# Patient Record
Sex: Female | Born: 1965 | Race: White | Hispanic: No | Marital: Married | Smoking: Former smoker
Health system: Southern US, Community
[De-identification: ages and names within clinical notes are randomized; demographics above are authoritative.]

## PROBLEM LIST (undated history)

## (undated) DIAGNOSIS — F32A Depression, unspecified: Secondary | ICD-10-CM

## (undated) DIAGNOSIS — Z6834 Body mass index (BMI) 34.0-34.9, adult: Secondary | ICD-10-CM

## (undated) DIAGNOSIS — E669 Obesity, unspecified: Secondary | ICD-10-CM

## (undated) DIAGNOSIS — F419 Anxiety disorder, unspecified: Secondary | ICD-10-CM

## (undated) DIAGNOSIS — J309 Allergic rhinitis, unspecified: Secondary | ICD-10-CM

## (undated) DIAGNOSIS — Z6835 Body mass index (BMI) 35.0-35.9, adult: Secondary | ICD-10-CM

## (undated) DIAGNOSIS — K219 Gastro-esophageal reflux disease without esophagitis: Secondary | ICD-10-CM

## (undated) DIAGNOSIS — F3189 Other bipolar disorder: Secondary | ICD-10-CM

## (undated) DIAGNOSIS — F410 Panic disorder [episodic paroxysmal anxiety] without agoraphobia: Secondary | ICD-10-CM

## (undated) DIAGNOSIS — E785 Hyperlipidemia, unspecified: Secondary | ICD-10-CM

## (undated) DIAGNOSIS — G603 Idiopathic progressive neuropathy: Secondary | ICD-10-CM

## (undated) DIAGNOSIS — I10 Essential (primary) hypertension: Secondary | ICD-10-CM

## (undated) DIAGNOSIS — M1990 Unspecified osteoarthritis, unspecified site: Secondary | ICD-10-CM

## (undated) DIAGNOSIS — G709 Myoneural disorder, unspecified: Secondary | ICD-10-CM

## (undated) DIAGNOSIS — R4701 Aphasia: Secondary | ICD-10-CM

## (undated) DIAGNOSIS — E559 Vitamin D deficiency, unspecified: Secondary | ICD-10-CM

## (undated) HISTORY — DX: Myoneural disorder, unspecified: G70.9

## (undated) HISTORY — DX: Depression, unspecified: F32.A

## (undated) HISTORY — PX: CHOLECYSTECTOMY: SHX55

## (undated) HISTORY — DX: Obesity, unspecified: E66.9

## (undated) HISTORY — PX: APPENDECTOMY: SHX54

## (undated) HISTORY — DX: Aphasia: R47.01

## (undated) HISTORY — DX: Anxiety disorder, unspecified: F41.9

## (undated) HISTORY — PX: BREAST SURGERY: SHX581

## (undated) HISTORY — PX: SPINE SURGERY: SHX786

## (undated) HISTORY — DX: Vitamin D deficiency, unspecified: E55.9

## (undated) HISTORY — DX: Panic disorder (episodic paroxysmal anxiety): F41.0

## (undated) HISTORY — DX: Allergic rhinitis, unspecified: J30.9

## (undated) HISTORY — DX: Essential (primary) hypertension: I10

## (undated) HISTORY — DX: Body mass index (BMI) 35.0-35.9, adult: Z68.35

## (undated) HISTORY — DX: Idiopathic progressive neuropathy: G60.3

## (undated) HISTORY — DX: Other bipolar disorder: F31.89

## (undated) HISTORY — PX: ABDOMINAL HYSTERECTOMY: SHX81

## (undated) HISTORY — DX: Unspecified osteoarthritis, unspecified site: M19.90

## (undated) HISTORY — DX: Gastro-esophageal reflux disease without esophagitis: K21.9

## (undated) HISTORY — DX: Body mass index (BMI) 34.0-34.9, adult: Z68.34

## (undated) HISTORY — DX: Hyperlipidemia, unspecified: E78.5

---

## 2017-12-18 DIAGNOSIS — N6310 Unspecified lump in the right breast, unspecified quadrant: Secondary | ICD-10-CM

## 2017-12-18 HISTORY — DX: Unspecified lump in the right breast, unspecified quadrant: N63.10

## 2018-01-22 DIAGNOSIS — Z09 Encounter for follow-up examination after completed treatment for conditions other than malignant neoplasm: Secondary | ICD-10-CM | POA: Insufficient documentation

## 2021-03-28 ENCOUNTER — Ambulatory Visit: Payer: 59 | Admitting: Podiatry

## 2021-03-28 ENCOUNTER — Ambulatory Visit (INDEPENDENT_AMBULATORY_CARE_PROVIDER_SITE_OTHER): Payer: 59

## 2021-03-28 ENCOUNTER — Other Ambulatory Visit: Payer: Self-pay

## 2021-03-28 DIAGNOSIS — M25371 Other instability, right ankle: Secondary | ICD-10-CM

## 2021-03-28 DIAGNOSIS — M25571 Pain in right ankle and joints of right foot: Secondary | ICD-10-CM | POA: Diagnosis not present

## 2021-03-28 DIAGNOSIS — M25471 Effusion, right ankle: Secondary | ICD-10-CM | POA: Diagnosis not present

## 2021-03-28 NOTE — Patient Instructions (Signed)
Ankle Instability Exercises:  Make sure you have a chair or wall in front of you for support at all times. You do not wear your brace while you do these exercises.   Stand with your feet hip-width apart and your hands on your hips. Shift your weight onto your left foot and lift your right foot a few inches off of the ground. Stand in this position for 30 seconds and switch sides. Repeat 2-3 times  Start at week 1. Only progress to the next week's activity if you can safely perform the exercise you are doing.  Week 1: Wearing shoes Week 2: Barefoot Week 3: Wearing shoes, eyes closed Week 4: Barefoot, eyes closed

## 2021-03-28 NOTE — Progress Notes (Signed)
  Subjective:  Patient ID: Yvonne Clark, female    DOB: [birth year],  MRN: [id number]  Chief Complaint  Patient presents with   Pain    Rt lateral ankle pain -radiates up the knee - x 3 yrs - no recent injury; 6 yrs ago rolled ankle on flat surface -pain started after working out -w/ swelling Tx: elevation, IBU and icing     55 y.o. female presents with the above complaint. History confirmed with patient.   Objective:  Physical Exam: warm, good capillary refill, no trophic changes or ulcerative lesions, normal DP and PT pulses, and normal sensory exam.  Right Foot: Right ankle POP at ATFL. No pain at CFL. +Anterior drawer right. Mild edema right ankle. Excessive inversion noted c/t contralateral limb.  No images are attached to the encounter.  Radiographs: X-ray of the right ankle: no fracture, dislocation, swelling or degenerative changes noted Assessment:   1. Instability of right ankle joint   2. Pain and swelling of right ankle     Plan:  Patient was evaluated and treated and all questions answered.  Ankle Instability Right Pod Sprain: -XR taken and reviewed with patient -Educated on etiology of injury -ASO Brace dispensed -Balance and Proprioception exercises dispensed.  Return in about 4 weeks (around 04/25/2021) for Ankle instability.

## 2021-04-25 ENCOUNTER — Ambulatory Visit: Payer: 59 | Admitting: Podiatry

## 2021-05-16 ENCOUNTER — Ambulatory Visit (INDEPENDENT_AMBULATORY_CARE_PROVIDER_SITE_OTHER): Payer: 59 | Admitting: Podiatry

## 2021-05-16 DIAGNOSIS — M25371 Other instability, right ankle: Secondary | ICD-10-CM

## 2021-05-16 DIAGNOSIS — M25571 Pain in right ankle and joints of right foot: Secondary | ICD-10-CM

## 2021-05-16 DIAGNOSIS — M25471 Effusion, right ankle: Secondary | ICD-10-CM

## 2021-05-16 NOTE — Progress Notes (Signed)
No show for appt - same day cancellation. 

## 2021-06-09 ENCOUNTER — Other Ambulatory Visit: Payer: Self-pay

## 2021-06-09 ENCOUNTER — Ambulatory Visit (INDEPENDENT_AMBULATORY_CARE_PROVIDER_SITE_OTHER): Payer: 59 | Admitting: Podiatry

## 2021-06-09 ENCOUNTER — Encounter: Payer: Self-pay | Admitting: Podiatry

## 2021-06-09 DIAGNOSIS — M25571 Pain in right ankle and joints of right foot: Secondary | ICD-10-CM

## 2021-06-09 DIAGNOSIS — M25471 Effusion, right ankle: Secondary | ICD-10-CM | POA: Diagnosis not present

## 2021-06-09 DIAGNOSIS — M25371 Other instability, right ankle: Secondary | ICD-10-CM | POA: Diagnosis not present

## 2021-06-09 MED ORDER — MELOXICAM 15 MG PO TABS: 15.0000 mg | ORAL_TABLET | Freq: Every day | ORAL | 0 refills | Status: DC

## 2021-06-16 NOTE — Progress Notes (Signed)
  Subjective:  Patient ID: Yvonne Clark, female    DOB: [birth year],  MRN: [id number]  Chief Complaint  Patient presents with   Foot Pain    Somewhat better and the brace hurts more and hurts my hips and I have had 4 back surgeries   55 y.o. female presents with the above complaint. History confirmed with patient.   Objective:  Physical Exam: warm, good capillary refill, no trophic changes or ulcerative lesions, normal DP and PT pulses, and normal sensory exam.  Right Foot: Right ankle POP at ATFL. No pain at CFL. +Anterior drawer right. Mild edema right ankle. Excessive inversion noted c/t contralateral limb.  Assessment:   1. Instability of right ankle joint   2. Pain and swelling of right ankle    Plan:  Patient was evaluated and treated and all questions answered.  Ankle Instability Right -I advised that if the brace is hurting more she can d/c it. -Rx Meloxicam -Refer to Physical Therapy for balance, strength, proprioception.  Return in about 6 weeks (around 07/21/2021) for ankle instability.

## 2021-06-30 ENCOUNTER — Other Ambulatory Visit: Payer: Self-pay | Admitting: Podiatry

## 2021-06-30 NOTE — Telephone Encounter (Signed)
Please advise 

## 2021-07-14 ENCOUNTER — Ambulatory Visit (INDEPENDENT_AMBULATORY_CARE_PROVIDER_SITE_OTHER): Payer: 59 | Admitting: Podiatry

## 2021-07-14 ENCOUNTER — Encounter: Payer: Self-pay | Admitting: Podiatry

## 2021-07-14 ENCOUNTER — Other Ambulatory Visit: Payer: Self-pay

## 2021-07-14 DIAGNOSIS — M25471 Effusion, right ankle: Secondary | ICD-10-CM | POA: Diagnosis not present

## 2021-07-14 DIAGNOSIS — M25571 Pain in right ankle and joints of right foot: Secondary | ICD-10-CM

## 2021-07-14 DIAGNOSIS — M25371 Other instability, right ankle: Secondary | ICD-10-CM

## 2021-07-14 NOTE — Progress Notes (Signed)
  Subjective:  Patient ID: Yvonne Clark, female    DOB: [birth year],  MRN: [id number]  Chief Complaint  Patient presents with   Foot Pain    I am not any better and the medicine knocked me out and PT stated that they could not do anything on the right ankle   55 y.o. female presents with the above complaint. History confirmed with patient. Feels like the ankle is not any better still cannot wear the brace and could not tolerate the medication she was prescribed.  Objective:  Physical Exam: warm, good capillary refill, no trophic changes or ulcerative lesions, normal DP and PT pulses, and normal sensory exam.  Right Foot: Right ankle POP at ATFL. No pain at CFL. +Anterior drawer right. Mild edema right ankle. Excessive inversion noted c/t contralateral limb.  Assessment:   1. Instability of right ankle joint   2. Pain and swelling of right ankle    Plan:  Patient was evaluated and treated and all questions answered.  Ankle Instability Right -She has now failed bracing, PT, Anti-inflammatory medication. She has been treated for this for over 3 months. Order MRI and evaluate ligaments. She may need ligament repair/augmentation.  Return in about 3 weeks (around 08/04/2021) for MRI review.

## 2021-07-19 ENCOUNTER — Telehealth: Payer: Self-pay | Admitting: Podiatry

## 2021-07-19 NOTE — Telephone Encounter (Signed)
FYI-pt cancelled appt for MRI review-states she is going elsewhere for 2nd opinion and no longer needs MRI.

## 2021-07-19 NOTE — Telephone Encounter (Signed)
Noted thank you

## 2021-07-21 ENCOUNTER — Ambulatory Visit: Payer: 59 | Admitting: Podiatry

## 2021-08-04 ENCOUNTER — Ambulatory Visit: Payer: 59 | Admitting: Podiatry

## 2021-08-17 ENCOUNTER — Other Ambulatory Visit: Payer: Self-pay | Admitting: Podiatry

## 2021-08-17 DIAGNOSIS — S93401A Sprain of unspecified ligament of right ankle, initial encounter: Secondary | ICD-10-CM

## 2021-08-23 ENCOUNTER — Ambulatory Visit
Admission: RE | Admit: 2021-08-23 | Discharge: 2021-08-23 | Disposition: A | Discharge status: Home / Self Care | Payer: 59 | Source: Ambulatory Visit | Attending: Podiatry | Admitting: Podiatry

## 2021-08-23 ENCOUNTER — Other Ambulatory Visit: Payer: Self-pay

## 2021-08-23 DIAGNOSIS — S93401A Sprain of unspecified ligament of right ankle, initial encounter: Secondary | ICD-10-CM

## 2021-08-23 IMAGING — MR MR ANKLE*R* W/O CM
5 series · 40 of 40 positions shown · non-contrast
Comparison: MR ankle [DATE]; X-ray ankle [DATE]; X-ray foot
[DATE].

CLINICAL DATA: Right lateral ankle pain on and off for 5 years
related to "rolled" ankle.

EXAM:
MRI OF THE RIGHT ANKLE WITHOUT CONTRAST
TECHNIQUE: Multiplanar, multisequence MR imaging of the ankle was performed. No
intravenous contrast was administered.

[Series 3: PD fat-sat · axial · right · 3.0mm · 0.50mm/px · z∈[-89,+50]mm · 10 of 36 slices shown]
[im 1/36]
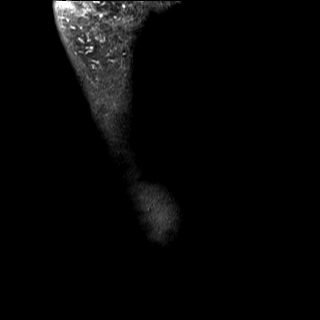
[im 4/36]
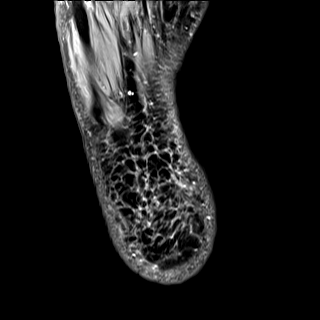
[im 8/36]
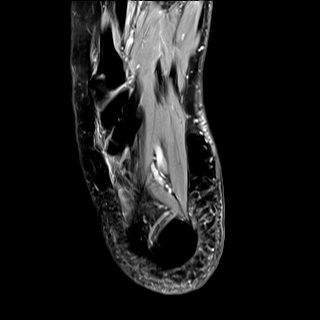
[im 12/36]
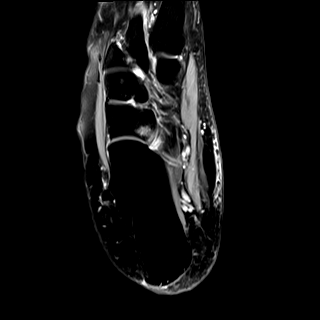
[im 16/36]
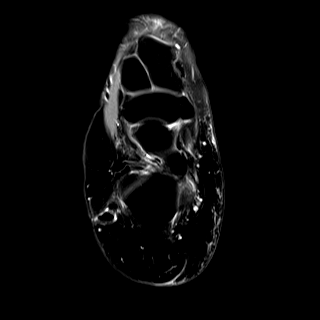
[im 20/36]
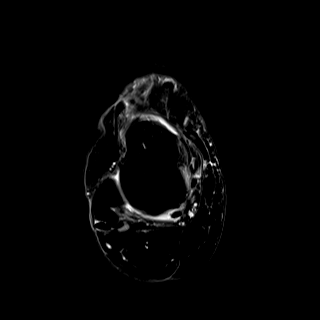
[im 24/36]
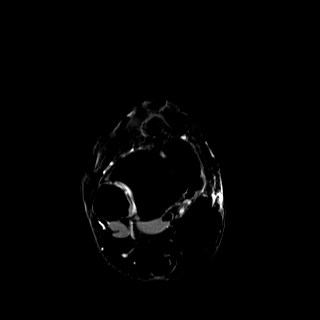
[im 28/36]
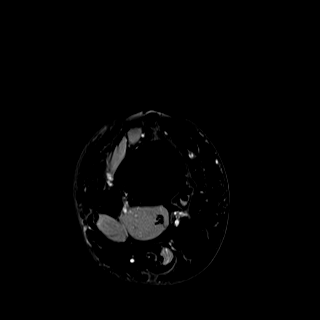
[im 32/36]
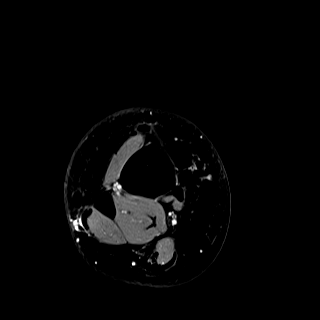
[im 36/36]
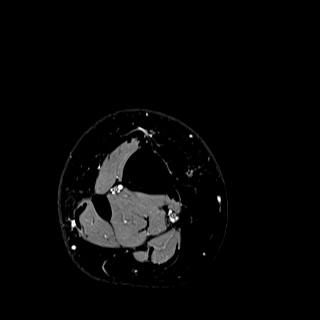

[Series 4: T2 fat-sat · axial · right · 3.0mm · 0.50mm/px · z∈[-89,+50]mm · 10 of 36 slices shown]
[im 1/36]
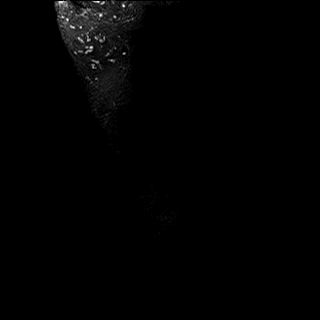
[im 4/36]
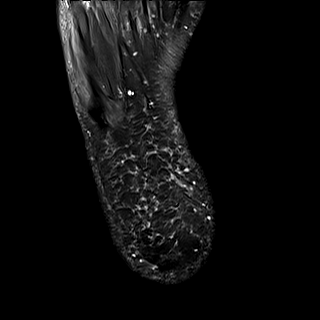
[im 8/36]
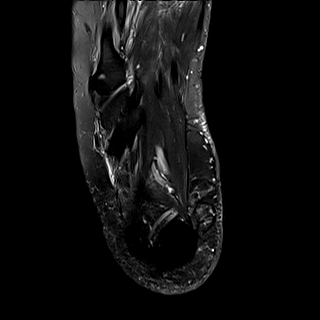
[im 12/36]
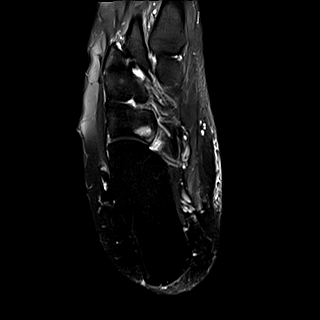
[im 16/36]
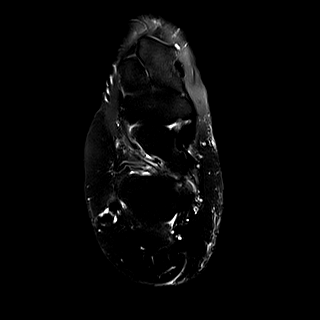
[im 20/36]
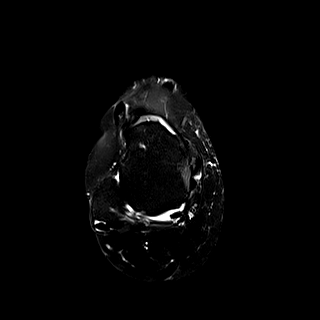
[im 24/36]
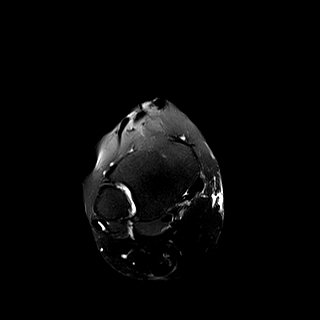
[im 28/36]
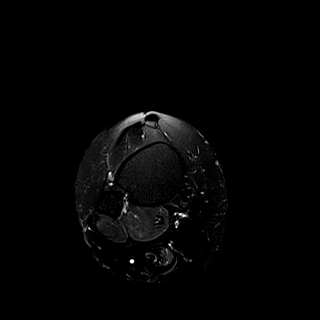
[im 32/36]
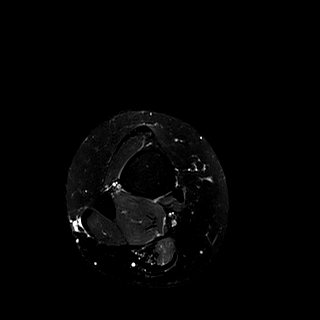
[im 36/36]
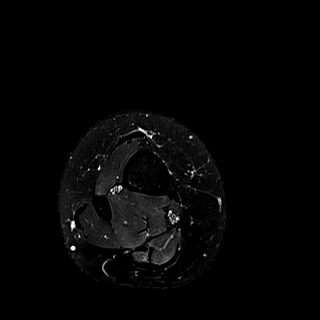

[Series 6: T2 · coronal · right · 3.0mm · 0.62mm/px · 10 of 40 slices shown]
[im 1/40]
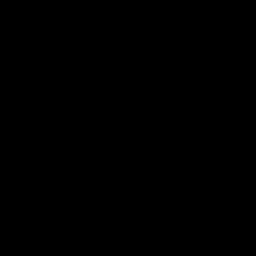
[im 5/40]
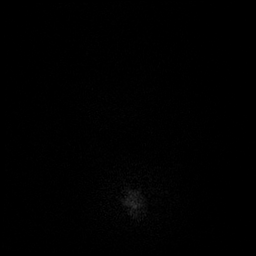
[im 9/40]
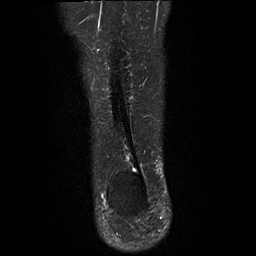
[im 14/40]
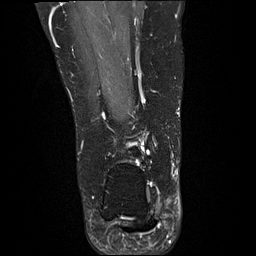
[im 18/40]
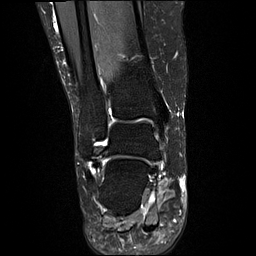
[im 22/40]
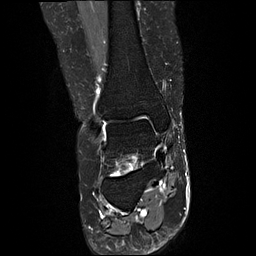
[im 27/40]
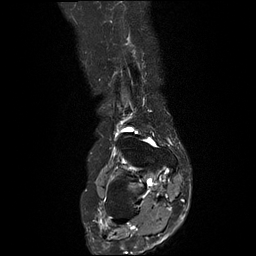
[im 31/40]
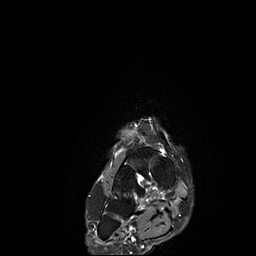
[im 35/40]
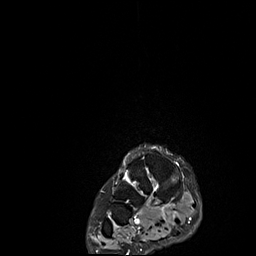
[im 40/40]
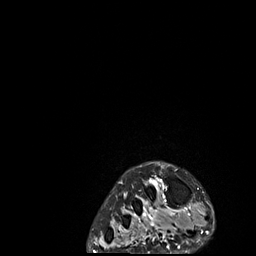

[Series 7: T1 · sagittal · right · 4.0mm · 0.70mm/px · 5 of 21 slices shown]
[im 1/21]
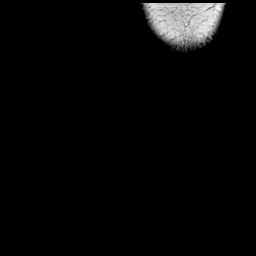
[im 6/21]
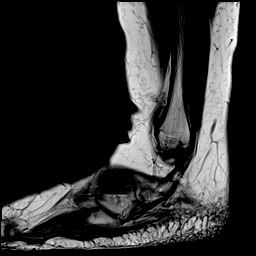
[im 11/21]
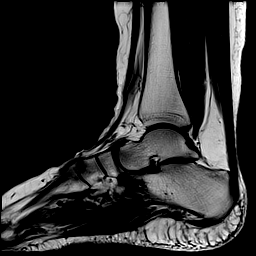
[im 16/21]
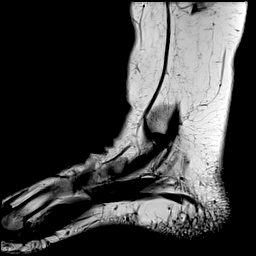
[im 21/21]
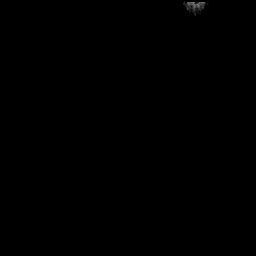

[Series 8: STIR · sagittal · right · 4.0mm · 0.35mm/px · 5 of 21 slices shown]
[im 1/21]
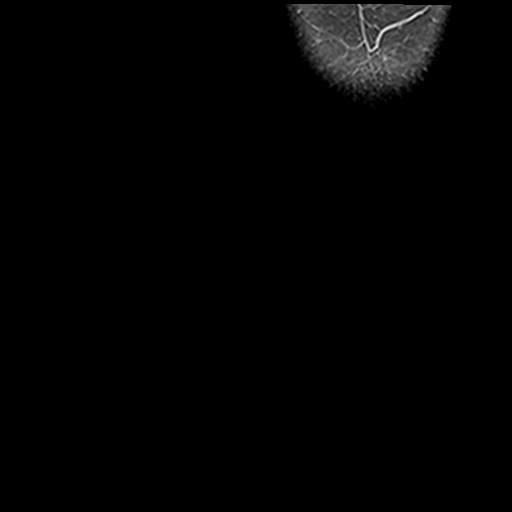
[im 6/21]
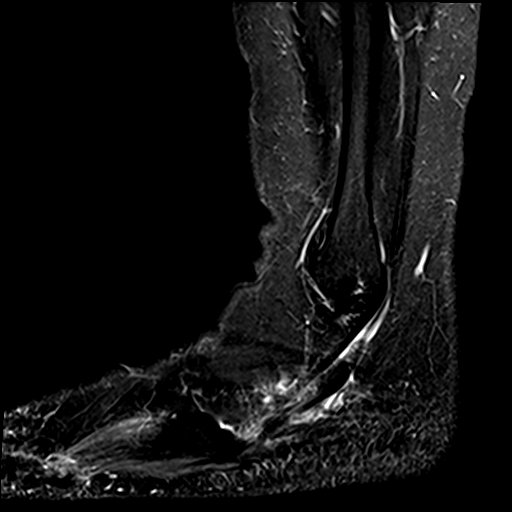
[im 11/21]
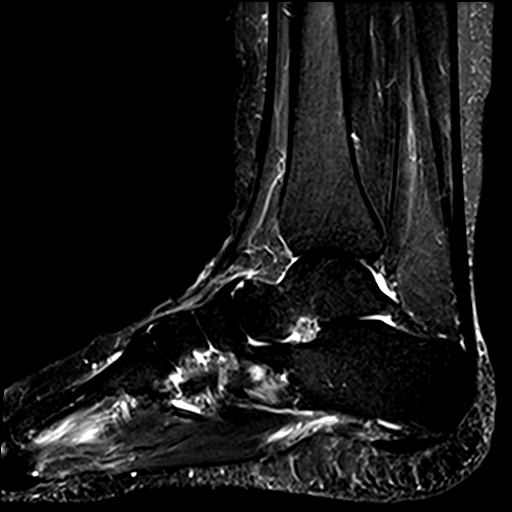
[im 16/21]
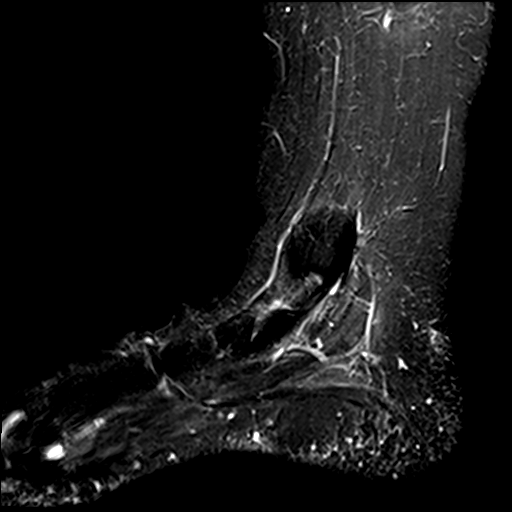
[im 21/21]
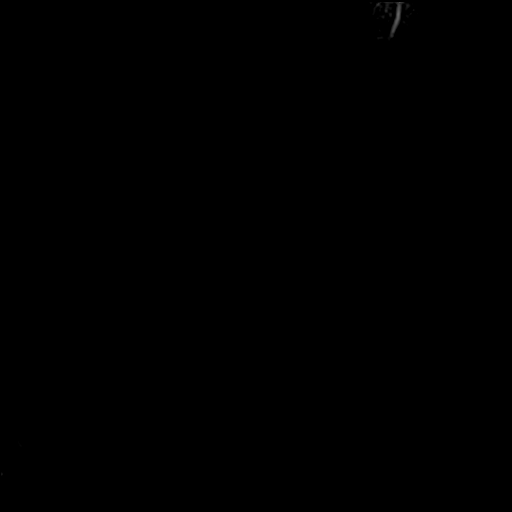

[40 of 40 positions shown; findings below may reference images not displayed]

FINDINGS: TENDONS

Peroneal: Peroneus longus and brevis tendons are intact and normally
positioned. Trace tenosynovitis.

Posteromedial: Tibialis posterior, flexor hallucis longus, and
flexor digitorum longus tendons are intact and normally positioned.

Anterior: Tibialis anterior, extensor hallucis longus, and extensor
digitorum longus tendons are intact and normally positioned.

Achilles: Intact.

Plantar Fascia: Mild thickening of the central band of the plantar
fascia without tear.

LIGAMENTS

Lateral: Attenuation of the anterior talofibular and calcaneofibular
ligaments without evidence of acute tear. Intact posterior
talofibular ligament. Intact tibiofibular ligaments.

Medial: Increased T2 signal within the deltoid ligament may
represent degeneration or sprain. Visualized spring ligament complex
appears intact.

CARTILAGE AND BONES

Ankle Joint: No significant ankle joint effusion. The talar dome and
tibial plafond are intact.

Subtalar Joints/Sinus Tarsi: No cartilage defect. No effusion.
Preservation of the anatomic fat within the sinus tarsi.

Bones: No acute fracture. No malalignment. No marrow signal
abnormality. No suspicious bone lesion.

Other: First MTP joint effusion is partially visualized at the edge
of the field of view. No significant soft tissue findings.
IMPRESSION: 1. No acute abnormality of the right ankle. No significant interval
changes from the previous MRI.
2. Sequela of remote prior lateral ankle ligament injury.
3. Deltoid ligament degeneration or sprain.
4. Nonspecific first MTP joint effusion is partially visualized at
the edge of the field of view.

## 2021-09-13 ENCOUNTER — Other Ambulatory Visit (INDEPENDENT_AMBULATORY_CARE_PROVIDER_SITE_OTHER): Payer: Self-pay | Admitting: Podiatry

## 2021-09-13 DIAGNOSIS — I739 Peripheral vascular disease, unspecified: Secondary | ICD-10-CM

## 2021-10-21 DIAGNOSIS — R202 Paresthesia of skin: Secondary | ICD-10-CM | POA: Insufficient documentation

## 2021-10-21 DIAGNOSIS — R200 Anesthesia of skin: Secondary | ICD-10-CM

## 2021-10-21 DIAGNOSIS — G8929 Other chronic pain: Secondary | ICD-10-CM | POA: Insufficient documentation

## 2021-10-21 HISTORY — DX: Other chronic pain: G89.29

## 2021-10-21 HISTORY — DX: Anesthesia of skin: R20.2

## 2021-10-21 HISTORY — DX: Anesthesia of skin: R20.0

## 2022-01-27 ENCOUNTER — Ambulatory Visit (INDEPENDENT_AMBULATORY_CARE_PROVIDER_SITE_OTHER): Payer: Commercial Managed Care - HMO

## 2022-01-27 DIAGNOSIS — I739 Peripheral vascular disease, unspecified: Secondary | ICD-10-CM | POA: Diagnosis not present

## 2022-02-02 ENCOUNTER — Ambulatory Visit
Admission: RE | Admit: 2022-02-02 | Discharge: 2022-02-02 | Disposition: A | Discharge status: Home / Self Care | Payer: Commercial Managed Care - HMO | Source: Ambulatory Visit | Attending: Podiatry | Admitting: Podiatry

## 2022-02-02 DIAGNOSIS — M25571 Pain in right ankle and joints of right foot: Secondary | ICD-10-CM | POA: Insufficient documentation

## 2022-02-02 DIAGNOSIS — M25371 Other instability, right ankle: Secondary | ICD-10-CM | POA: Diagnosis not present

## 2022-02-02 DIAGNOSIS — M25471 Effusion, right ankle: Secondary | ICD-10-CM | POA: Insufficient documentation

## 2022-02-02 IMAGING — MR MR ANKLE*R* W/O CM
5 series · 40 of 40 positions shown · non-contrast
Comparison: Right ankle radiographs [DATE], MRI right ankle
[DATE]

CLINICAL DATA: Right ankle pain and instability.

EXAM:
MRI OF THE RIGHT ANKLE WITHOUT CONTRAST
TECHNIQUE: Multiplanar, multisequence MR imaging of the ankle was performed. No
intravenous contrast was administered.

[Series 3: PD fat-sat · axial · right · 3.0mm · 0.50mm/px · z∈[-141,+7]mm · 9 of 38 slices shown]
[im 1/38]
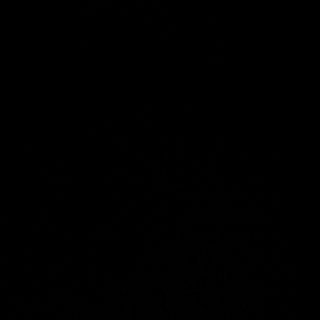
[im 5/38]
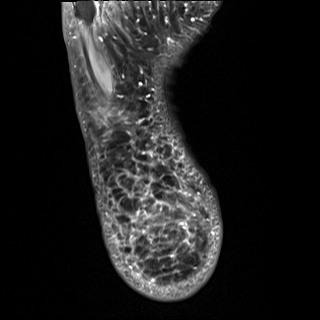
[im 10/38]
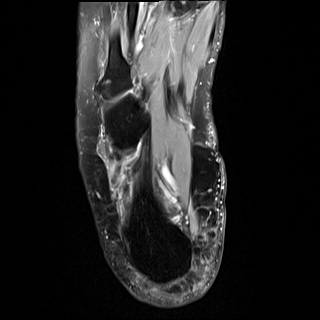
[im 14/38]
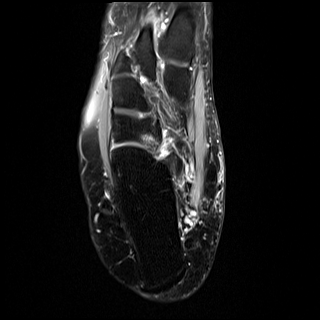
[im 19/38]
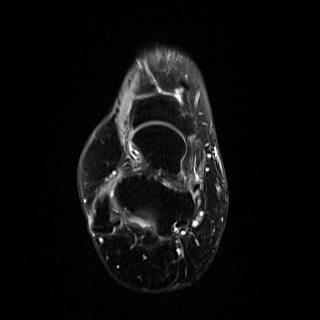
[im 24/38]
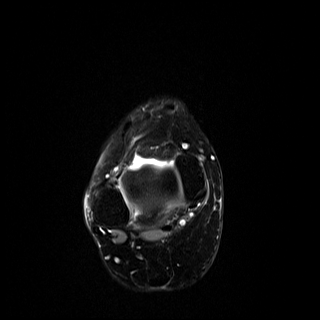
[im 28/38]
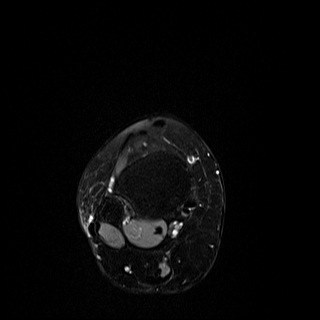
[im 33/38]
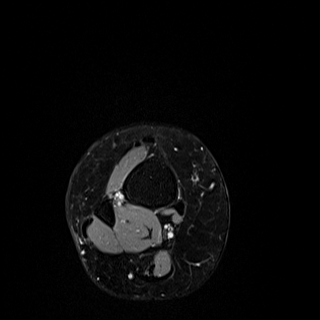
[im 38/38]
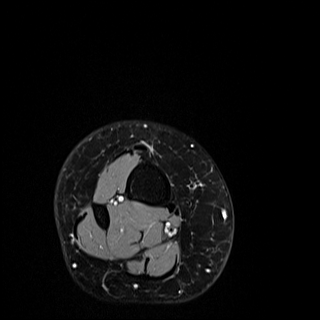

[Series 4: T2 fat-sat · axial · right · 3.0mm · 0.50mm/px · z∈[-141,+7]mm · 9 of 38 slices shown]
[im 1/38]
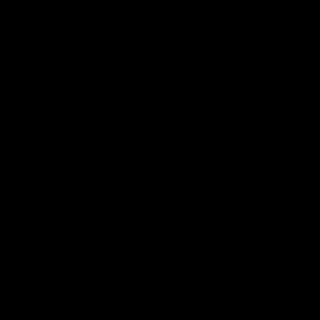
[im 5/38]
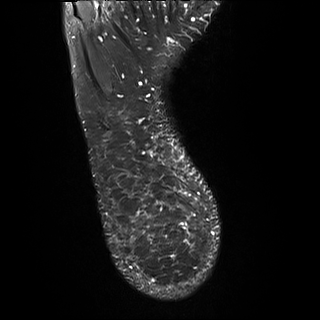
[im 10/38]
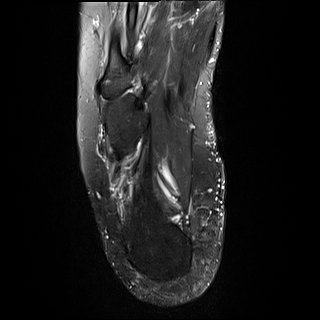
[im 14/38]
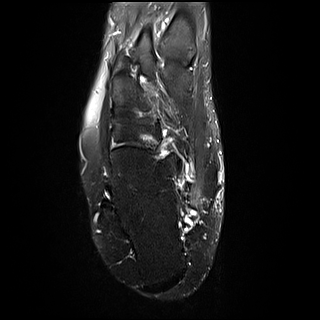
[im 19/38]
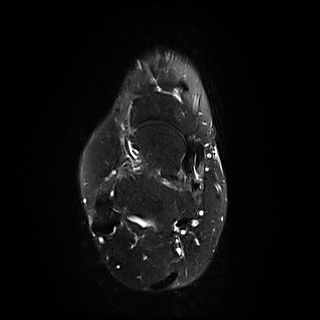
[im 24/38]
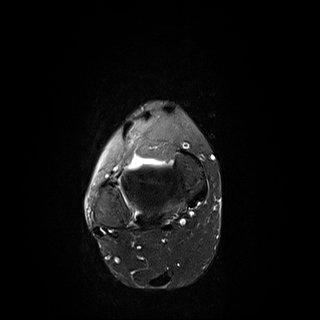
[im 28/38]
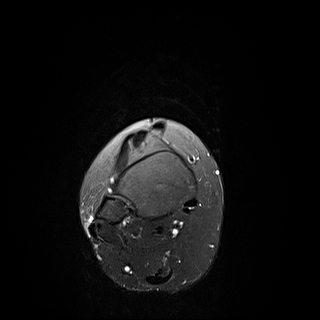
[im 33/38]
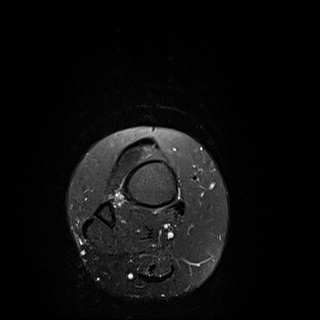
[im 38/38]
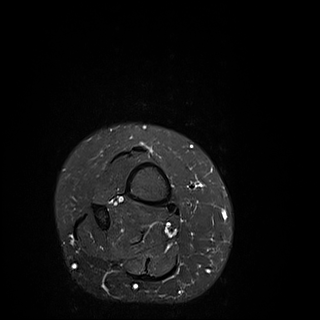

[Series 7: T1 · sagittal · right · 4.0mm · 0.70mm/px · 6 of 23 slices shown]
[im 1/23]
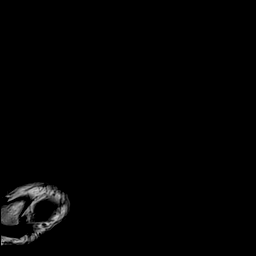
[im 5/23]
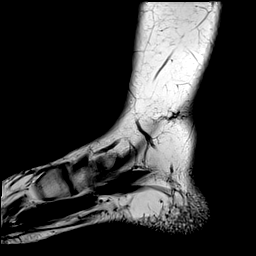
[im 9/23]
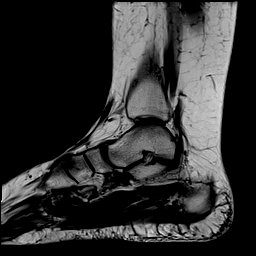
[im 14/23]
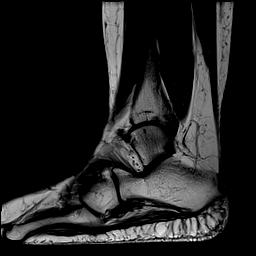
[im 18/23]
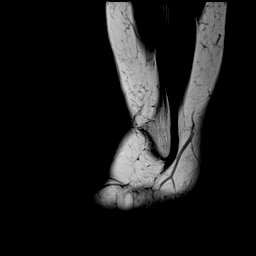
[im 23/23]
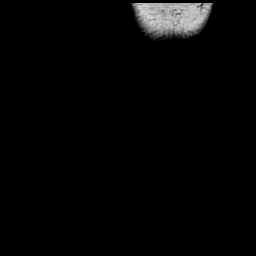

[Series 10: T2 · coronal · right · 3.0mm · 0.62mm/px · 10 of 40 slices shown]
[im 1/40]
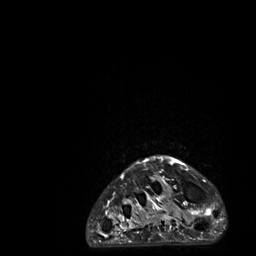
[im 5/40]
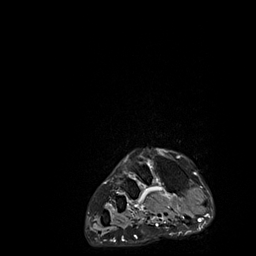
[im 9/40]
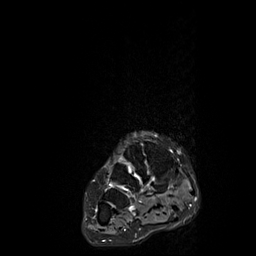
[im 14/40]
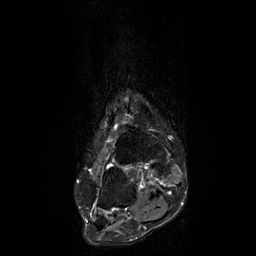
[im 18/40]
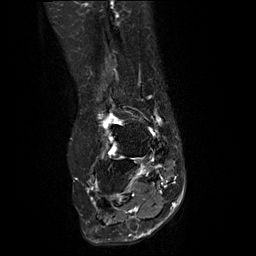
[im 22/40]
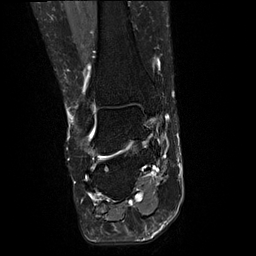
[im 27/40]
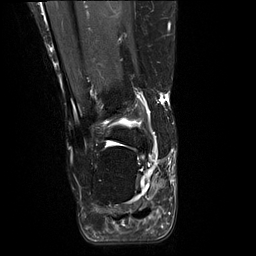
[im 31/40]
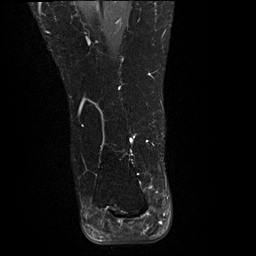
[im 35/40]
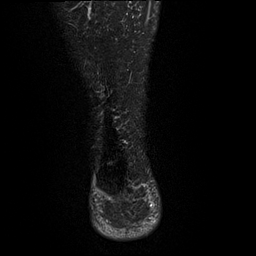
[im 40/40]
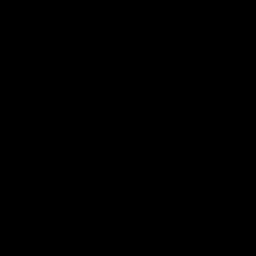

[Series 11: STIR · sagittal · right · 4.0mm · 0.35mm/px · 6 of 23 slices shown]
[im 1/23]
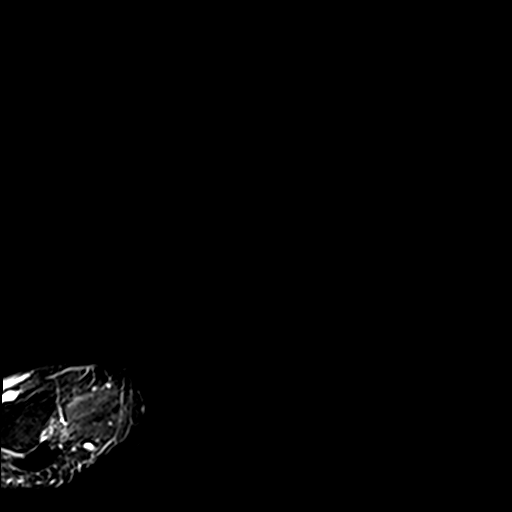
[im 5/23]
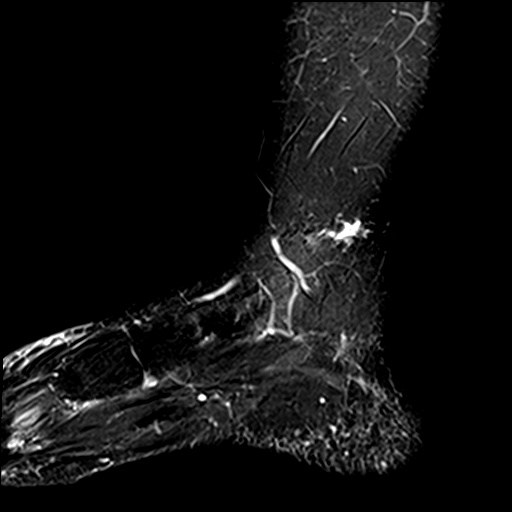
[im 9/23]
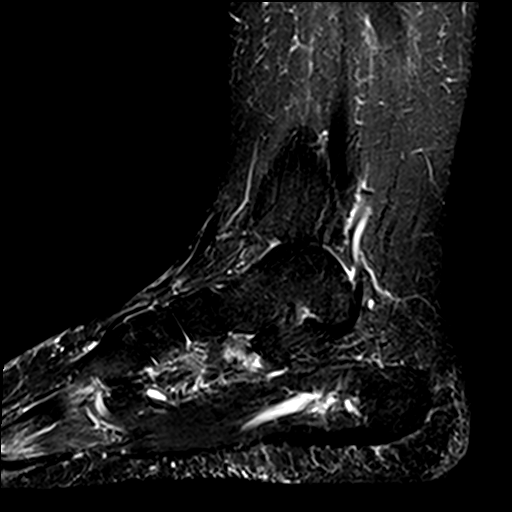
[im 14/23]
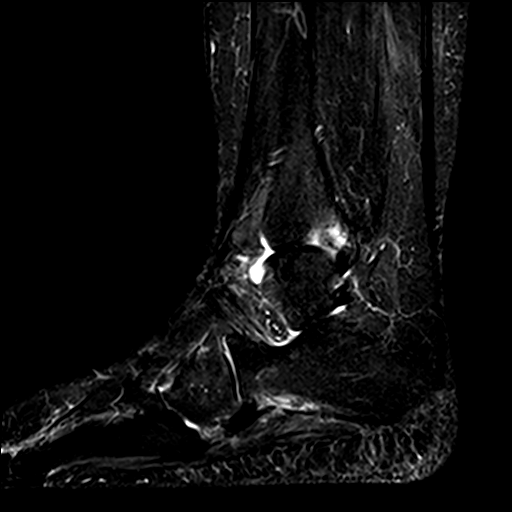
[im 18/23]
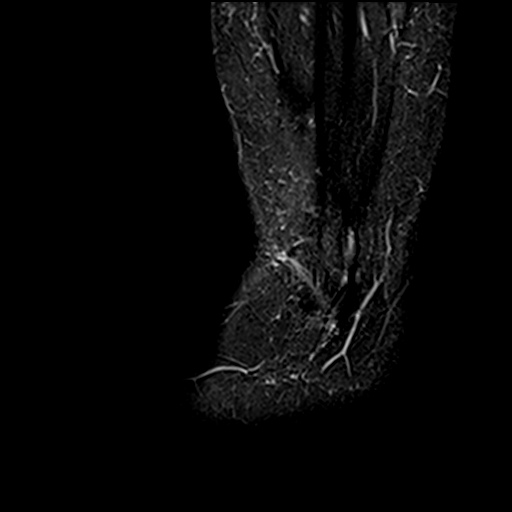
[im 23/23]
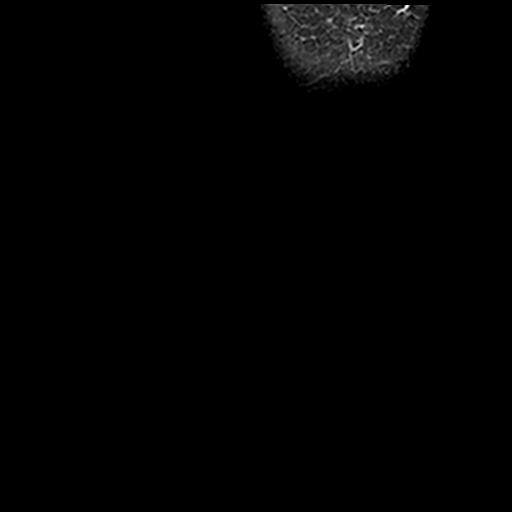

[40 of 40 positions shown; findings below may reference images not displayed]

FINDINGS: TENDONS

Peroneal: Mild peroneus longus and brevis tenosynovitis, similar to
prior.

Posteromedial: Intact tibialis posterior, flexor digitorum longus,
and flexor hallucis longus tendons.

Anterior: The tibialis anterior, extensor hallucis longus, and
extensor digitorum longus tendons are intact.

Achilles: Intact.

Plantar Fascia: Intact.

LIGAMENTS

Lateral: Mild thickening of the anterior talofibular ligament
compared to the prior attenuation, with mild intermediate T2 signal
currently. This suggests a chronic sprain. Mild attenuation of the
calcaneofibular ligament is unchanged. The posterior talofibular and
anterior and posterior tibiofibular ligaments are intact.

Medial: There is again mild increased T2 signal within the
tibiotalar deep deltoid ligament again likely representing chronic
degenerative change. The visualized spring ligament complex again
appears intact.

CARTILAGE

Ankle Joint: Intact cartilage.

Subtalar Joints/Sinus Tarsi: Mild thinning of the posterior subtalar
joint cartilage. Fat is preserved within the sinus tarsi.

Bones: Mild dorsal talonavicular degenerative osteophytosis.

Other: The tarsal tunnel is unremarkable. The Lisfranc ligament
complex is intact.

There is again a minimally partially visualized (sagittal series 11,
image 23) likely mild great toe metatarsophalangeal joint effusion.
IMPRESSION: 1. Mild thickening of the anterior talofibular ligament compared to
the mild attenuation seen previously. Mild intermediate T2 signal of
the ligament further suggesting tendinosis/chronic sprain. No fluid
bright tear.
2. Unchanged mild chronic attenuation of the calcaneofibular
ligament.
3. Chronic degenerative change within the tibiotalar deep deltoid
ligament.
4. Mild peroneus longus and brevis tenosynovitis, similar to prior.

## 2022-02-19 DIAGNOSIS — Z789 Other specified health status: Secondary | ICD-10-CM

## 2022-02-19 DIAGNOSIS — Z79899 Other long term (current) drug therapy: Secondary | ICD-10-CM

## 2022-02-19 DIAGNOSIS — G894 Chronic pain syndrome: Secondary | ICD-10-CM | POA: Insufficient documentation

## 2022-02-19 DIAGNOSIS — M899 Disorder of bone, unspecified: Secondary | ICD-10-CM

## 2022-02-19 HISTORY — DX: Other long term (current) drug therapy: Z79.899

## 2022-02-19 HISTORY — DX: Other specified health status: Z78.9

## 2022-02-19 HISTORY — DX: Disorder of bone, unspecified: M89.9

## 2022-02-19 HISTORY — DX: Chronic pain syndrome: G89.4

## 2022-02-19 NOTE — Progress Notes (Unsigned)
Patient: Yvonne Clark  Service Category: E/M  Provider: Gaspar Cola, MD  DOB: 11/13/1965  DOS: 02/20/2022  Referring Provider: Caroline More, DPM  MRN: [id number]  Setting: Ambulatory outpatient  PCP: Imagene Riches, NP  Type: New Patient  Specialty: Interventional Pain Management    Location: Office  Delivery: Face-to-face     Primary Reason(s) for Visit: Encounter for initial evaluation of one or more chronic problems (new to examiner) potentially causing chronic pain, and posing a threat to normal musculoskeletal function. (Level of risk: High) CC: No chief complaint on file.  HPI  Yvonne Clark is a 56 y.o. year old, female patient, who comes for the first time to our practice referred by Caroline More, DPM for our initial evaluation of her chronic pain. She has Breast mass, right; Postoperative examination; Chronic pain of right ankle; Numbness and tingling; Chronic pain syndrome; Pharmacologic therapy; Disorder of skeletal system; and Problems influencing health status on their problem list. Today she comes in for evaluation of her No chief complaint on file.  Pain Assessment: Location:     Radiating:   Onset:   Duration:   Quality:   Severity:  /10 (subjective, self-reported pain score)  Effect on ADL:   Timing:   Modifying factors:   BP:    HR:    Onset and Duration: {Hx; Onset and Duration:210120511} Cause of pain: {Hx; Cause:210120521} Severity: {Pain Severity:210120502} Timing: {Symptoms; Timing:210120501} Aggravating Factors: {Causes; Aggravating pain factors:210120507} Alleviating Factors: {Causes; Alleviating Factors:210120500} Associated Problems: {Hx; Associated problems:210120515} Quality of Pain: {Hx; Symptom quality or Descriptor:210120531} Previous Examinations or Tests: {Hx; Previous examinations or test:210120529} Previous Treatments: {Hx; Previous Treatment:210120503}  ***  Today I took the time to provide the patient with information regarding  my pain practice. The patient was informed that my practice is divided into two sections: an interventional pain management section, as well as a completely separate and distinct medication management section. I explained that I have procedure days for my interventional therapies, and evaluation days for follow-ups and medication management. Because of the amount of documentation required during both, they are kept separated. This means that there is the possibility that she may be scheduled for a procedure on one day, and medication management the next. I have also informed her that because of staffing and facility limitations, I no longer take patients for medication management only. To illustrate the reasons for this, I gave the patient the example of surgeons, and how inappropriate it would be to refer a patient to his/her care, just to write for the post-surgical antibiotics on a surgery done by a different surgeon.   Because interventional pain management is my board-certified specialty, the patient was informed that joining my practice means that they are open to any and all interventional therapies. I made it clear that this does not mean that they will be forced to have any procedures done. What this means is that I believe interventional therapies to be essential part of the diagnosis and proper management of chronic pain conditions. Therefore, patients not interested in these interventional alternatives will be better served under the care of a different practitioner.  The patient was also made aware of my Comprehensive Pain Management Safety Guidelines where by joining my practice, they limit all of their nerve blocks and joint injections to those done by our practice, for as long as we are retained to manage their care.   Historic Controlled Substance Pharmacotherapy Review  PMP and historical list of controlled  substances: ***  Current opioid analgesics:   *** MME/day: *** mg/day  Historical  Monitoring: The patient  has no history on file for drug use. List of all UDS Test(s): No results found for: "MDMA", "COCAINSCRNUR", "PCPSCRNUR", "PCPQUANT", "CANNABQUANT", "THCU", "ETH" List of other Serum/Urine Drug Screening Test(s):  No results found for: "AMPHSCRSER", "BARBSCRSER", "BENZOSCRSER", "COCAINSCRSER", "COCAINSCRNUR", "PCPSCRSER", "PCPQUANT", "THCSCRSER", "THCU", "CANNABQUANT", "OPIATESCRSER", "OXYSCRSER", "PROPOXSCRSER", "ETH" Historical Background Evaluation: [place] PMP: PDMP reviewed during this encounter. Online review of the past 75-month period conducted.             PMP NARX Score Report:  Narcotic: 150 Sedative: 380 Stimulant: 000 [place] Department of public safety, offender search: Editor, commissioning Information) Non-contributory Risk Assessment Profile: Aberrant behavior: None observed or detected today Risk factors for fatal opioid overdose: None identified today PMP NARX Overdose Risk Score: 100 Fatal overdose hazard ratio (HR): Calculation deferred Non-fatal overdose hazard ratio (HR): Calculation deferred Risk of opioid abuse or dependence: 0.7-3.0% with doses ? 36 MME/day and 6.1-26% with doses ? 120 MME/day. Substance use disorder (SUD) risk level: See below Personal History of Substance Abuse (SUD-Substance use disorder):  Alcohol:    Illegal Drugs:    Rx Drugs:    ORT Risk Level calculation:    ORT Scoring interpretation table:  Score <3 = Low Risk for SUD  Score between 4-7 = Moderate Risk for SUD  Score >8 = High Risk for Opioid Abuse   PHQ-2 Depression Scale:  Total score:    PHQ-2 Scoring interpretation table: (Score and probability of major depressive disorder)  Score 0 = No depression  Score 1 = 15.4% Probability  Score 2 = 21.1% Probability  Score 3 = 38.4% Probability  Score 4 = 45.5% Probability  Score 5 = 56.4% Probability  Score 6 = 78.6% Probability   PHQ-9 Depression Scale:  Total score:    PHQ-9 Scoring interpretation table:  Score 0-4 = No  depression  Score 5-9 = Mild depression  Score 10-14 = Moderate depression  Score 15-19 = Moderately severe depression  Score 20-27 = Severe depression (2.4 times higher risk of SUD and 2.89 times higher risk of overuse)   Pharmacologic Plan: As per protocol, I have not taken over any controlled substance management, pending the results of ordered tests and/or consults.            Initial impression: Pending review of available data and ordered tests.  Meds   Current Outpatient Medications:    ARIPiprazole (ABILIFY) 10 MG tablet, Take 1/2 tab at night for two weeks then increase to 1 tab at night and continue that dose, Disp: , Rfl:    citalopram (CELEXA) 20 MG tablet, Take by mouth., Disp: , Rfl:    clonazePAM (KLONOPIN) 1 MG tablet, Take by mouth., Disp: , Rfl:    gabapentin (NEURONTIN) 100 MG capsule, Take 100 mg twice a day for one week, then increase to 200 mg(2 tablets) twice a day and continue, Disp: , Rfl:    Ubrogepant (UBRELVY) 100 MG TABS, TAKE 1 TABLET BY MOUTH ONCE DAILY AS NEEDED FOR MIGRAINE HEADACHE, Disp: , Rfl:    valACYclovir (VALTREX) 1000 MG tablet, Take 1 tablet by mouth 2 (two) times daily., Disp: , Rfl:    valACYclovir (VALTREX) 500 MG tablet, Take by mouth., Disp: , Rfl:    ACETAMINOPHEN EXTRA STRENGTH 500 MG tablet, Take 500 mg by mouth every 8 (eight) hours as needed., Disp: , Rfl:    ARIPiprazole (ABILIFY) 10 MG tablet, Take  by mouth., Disp: , Rfl:    azithromycin (ZITHROMAX) 250 MG tablet, Take by mouth., Disp: , Rfl:    buPROPion (WELLBUTRIN XL) 300 MG 24 hr tablet, Take 300 mg by mouth daily., Disp: , Rfl:    citalopram (CELEXA) 20 MG tablet, Take 20 mg by mouth daily., Disp: , Rfl:    clonazePAM (KLONOPIN) 1 MG tablet, Take 1 mg by mouth 3 (three) times daily., Disp: , Rfl:    gabapentin (NEURONTIN) 100 MG capsule, Take by mouth., Disp: , Rfl:    ibuprofen (ADVIL) 600 MG tablet, Take 600 mg by mouth every 6 (six) hours as needed., Disp: , Rfl:    meloxicam  (MOBIC) 15 MG tablet, TAKE 1 TABLET (15 MG TOTAL) BY MOUTH DAILY., Disp: 30 tablet, Rfl: 0   predniSONE (DELTASONE) 10 MG tablet, Take by mouth., Disp: , Rfl:    predniSONE (DELTASONE) 20 MG tablet, Take 40 mg by mouth daily., Disp: , Rfl:    UBRELVY 100 MG TABS, Take 1 tablet by mouth daily as needed., Disp: , Rfl:    valACYclovir (VALTREX) 1000 MG tablet, Take 1,000 mg by mouth 2 (two) times daily., Disp: , Rfl:    valACYclovir (VALTREX) 500 MG tablet, Take 500 mg by mouth 2 (two) times daily., Disp: , Rfl:   Imaging Review  Ankle Imaging: Ankle-R DG Complete: Results for orders placed in visit on 03/28/21 DG Ankle Complete Right  Narrative Please see detailed radiograph report in office note.  Complexity Note: Imaging results reviewed. Results shared with Yvonne Clark, using Layman's terms.                        ROS  Cardiovascular: {Hx; Cardiovascular History:210120525} Pulmonary or Respiratory: {Hx; Pumonary and/or Respiratory History:210120523} Neurological: {Hx; Neurological:210120504} Psychological-Psychiatric: {Hx; Psychological-Psychiatric History:210120512} Gastrointestinal: {Hx; Gastrointestinal:210120527} Genitourinary: {Hx; Genitourinary:210120506} Hematological: {Hx; Hematological:210120510} Endocrine: {Hx; Endocrine history:210120509} Rheumatologic: {Hx; Rheumatological:210120530} Musculoskeletal: {Hx; Musculoskeletal:210120528} Work History: {Hx; Work history:210120514}  Allergies  Yvonne Clark is allergic to ciprofloxacin, codeine, hydrocodone-acetaminophen, iodine, sulfa antibiotics, and penicillins.  Laboratory Chemistry Profile   Renal No results found for: "BUN", "CREATININE", "LABCREA", "BCR", "GFR", "GFRAA", "GFRNONAA", "SPECGRAV", "PHUR", "PROTEINUR"   Electrolytes No results found for: "NA", "K", "CL", "CALCIUM", "MG", "PHOS"   Hepatic No results found for: "AST", "ALT", "ALBUMIN", "ALKPHOS", "AMYLASE", "LIPASE", "AMMONIA"   ID No results  found for: "LYMEIGGIGMAB", "HIV", "SARSCOV2NAA", "STAPHAUREUS", "MRSAPCR", "HCVAB", "PREGTESTUR", "RMSFIGG", "QFVRPH1IGG", "QFVRPH2IGG"   Bone No results found for: "VD25OH", "VD125OH2TOT", "SU1103PR9", "YV8592TW4", "25OHVITD1", "25OHVITD2", "25OHVITD3", "TESTOFREE", "TESTOSTERONE"   Endocrine No results found for: "GLUCOSE", "GLUCOSEU", "HGBA1C", "TSH", "FREET4", "TESTOFREE", "TESTOSTERONE", "SHBG", "ESTRADIOL", "ESTRADIOLPCT", "ESTRADIOLFRE", "LABPREG", "ACTH", "CRTSLPL", "UCORFRPERLTR", "UCORFRPERDAY", "CORTISOLBASE"   Neuropathy No results found for: "VITAMINB12", "FOLATE", "HGBA1C", "HIV"   CNS No results found for: "COLORCSF", "APPEARCSF", "RBCCOUNTCSF", "WBCCSF", "POLYSCSF", "LYMPHSCSF", "EOSCSF", "PROTEINCSF", "GLUCCSF", "JCVIRUS", "CSFOLI", "IGGCSF", "LABACHR", "ACETBL"   Inflammation (CRP: Acute  ESR: Chronic) No results found for: "CRP", "ESRSEDRATE", "LATICACIDVEN"   Rheumatology No results found for: "RF", "ANA", "LABURIC", "URICUR", "LYMEIGGIGMAB", "LYMEABIGMQN", "HLAB27"   Coagulation No results found for: "INR", "LABPROT", "APTT", "PLT", "DDIMER", "LABHEMA", "VITAMINK1", "AT3"   Cardiovascular No results found for: "BNP", "CKTOTAL", "CKMB", "TROPONINI", "HGB", "HCT", "LABVMA", "EPIRU", "EPINEPH24HUR", "NOREPRU", "NOREPI24HUR", "DOPARU", "DOPAM24HRUR"   Screening No results found for: "[place]", "COVIDSOURCE", "STAPHAUREUS", "MRSAPCR", "HCVAB", "HIV", "PREGTESTUR"   Cancer No results found for: "CEA", "CA125", "LABCA2"   Allergens No results found for: "ALMOND", "APPLE", "ASPARAGUS", "AVOCADO", "BANANA", "BARLEY", "BASIL", "BAYLEAF", "GREENBEAN", "LIMABEAN", "WHITEBEAN", "BEEFIGE", "  REDBEET", "BLUEBERRY", "BROCCOLI", "CABBAGE", "MELON", "CARROT", "CASEIN", "CASHEWNUT", "CAULIFLOWER", "CELERY"     Note: No results found under the [employer] electronic medical record  [facility]  Drug: Yvonne Clark  has no history on file for drug use. Alcohol:  has no history on  file for alcohol use. Tobacco:  has no history on file for tobacco use. Medical:  has no past medical history on file. Family: family history is not on file.  No past surgical history on file. Active Ambulatory Problems    Diagnosis Date Noted   Breast mass, right 12/18/2017   Postoperative examination 01/22/2018   Chronic pain of right ankle 10/21/2021   Numbness and tingling 10/21/2021   Chronic pain syndrome 02/19/2022   Pharmacologic therapy 02/19/2022   Disorder of skeletal system 02/19/2022   Problems influencing health status 02/19/2022   Resolved Ambulatory Problems    Diagnosis Date Noted   No Resolved Ambulatory Problems   No Additional Past Medical History   Constitutional Exam  General appearance: Well nourished, well developed, and well hydrated. In no apparent acute distress There were no vitals filed for this visit. BMI Assessment: There is no height or weight on file to calculate BMI.  BMI interpretation table: BMI level Category Range association with higher incidence of chronic pain  <18 kg/m2 Underweight   18.5-24.9 kg/m2 Ideal body weight   25-29.9 kg/m2 Overweight Increased incidence by 20%  30-34.9 kg/m2 Obese (Class I) Increased incidence by 68%  35-39.9 kg/m2 Severe obesity (Class II) Increased incidence by 136%  >40 kg/m2 Extreme obesity (Class III) Increased incidence by 254%   Patient's current BMI Ideal Body weight  There is no height or weight on file to calculate BMI. Patient weight not recorded   BMI Readings from Last 4 Encounters:  No data found for BMI   Wt Readings from Last 4 Encounters:  No data found for Wt    Psych/Mental status: Alert, oriented x 3 (person, place, & time)       Eyes: PERLA Respiratory: No evidence of acute respiratory distress  Assessment  Primary Diagnosis & Pertinent Problem List: The primary encounter diagnosis was Chronic pain syndrome. Diagnoses of Pharmacologic therapy, Disorder of skeletal system, and  Problems influencing health status were also pertinent to this visit.  Visit Diagnosis (New problems to examiner): 1. Chronic pain syndrome   2. Pharmacologic therapy   3. Disorder of skeletal system   4. Problems influencing health status    Plan of Care (Initial workup plan)  Note: Yvonne Clark was reminded that as per protocol, today's visit has been an evaluation only. We have not taken over the patient's controlled substance management.  Problem-specific plan: No problem-specific Assessment & Plan notes found for this encounter.  Lab Orders  No laboratory test(s) ordered today   Imaging Orders  No imaging studies ordered today   Referral Orders  No referral(s) requested today   Procedure Orders    No procedure(s) ordered today   Pharmacotherapy (current): Medications ordered:  No orders of the defined types were placed in this encounter.  Medications administered during this visit: Yvonne Clark had no medications administered during this visit.   Pharmacological management options:  Opioid Analgesics: The patient was informed that there is no guarantee that she would be a candidate for opioid analgesics. The decision will be made following CDC guidelines. This decision will be based on the results of diagnostic studies, as well as Ms. Von Kohler's risk profile.  Membrane stabilizer: To be determined at a later time  Muscle relaxant: To be determined at a later time  NSAID: To be determined at a later time  Other analgesic(s): To be determined at a later time   Interventional management options: Ms. Ebelyn Bohnet was informed that there is no guarantee that she would be a candidate for interventional therapies. The decision will be based on the results of diagnostic studies, as well as Ms. Von Kohler's risk profile.  Procedure(s) under consideration:  Pending results of ordered studies      Interventional Therapies  Risk  Complexity Considerations:   There  is no height or weight on file to calculate BMI. WNL   Planned  Pending:   Pending further evaluation   Under consideration:   ***   Completed:   None at this time   Therapeutic  Palliative (PRN) options:   None established      Provider-requested follow-up: No follow-ups on file.  Future Appointments  Date Time Provider [place]  02/20/2022 10:00 AM Milinda Pointer, MD [facility] None    Note by: Gaspar Cola, MD Date: 02/20/2022; Time: 10:36 AM

## 2022-02-20 ENCOUNTER — Encounter: Payer: Self-pay | Admitting: Pain Medicine

## 2022-02-20 ENCOUNTER — Ambulatory Visit: Payer: Commercial Managed Care - HMO | Attending: Pain Medicine | Admitting: Pain Medicine

## 2022-02-20 VITALS — BP 113/79 | HR 80 | Temp 97.4°F | Resp 16 | Ht 63.0 in | Wt 178.7 lb

## 2022-02-20 DIAGNOSIS — G8929 Other chronic pain: Secondary | ICD-10-CM

## 2022-02-20 DIAGNOSIS — Z789 Other specified health status: Secondary | ICD-10-CM

## 2022-02-20 DIAGNOSIS — G894 Chronic pain syndrome: Secondary | ICD-10-CM | POA: Diagnosis present

## 2022-02-20 DIAGNOSIS — Z79899 Other long term (current) drug therapy: Secondary | ICD-10-CM | POA: Diagnosis present

## 2022-02-20 DIAGNOSIS — M792 Neuralgia and neuritis, unspecified: Secondary | ICD-10-CM | POA: Diagnosis present

## 2022-02-20 DIAGNOSIS — M25571 Pain in right ankle and joints of right foot: Secondary | ICD-10-CM | POA: Diagnosis not present

## 2022-02-20 DIAGNOSIS — M659 Synovitis and tenosynovitis, unspecified: Secondary | ICD-10-CM | POA: Insufficient documentation

## 2022-02-20 DIAGNOSIS — M65971 Unspecified synovitis and tenosynovitis, right ankle and foot: Secondary | ICD-10-CM

## 2022-02-20 DIAGNOSIS — R937 Abnormal findings on diagnostic imaging of other parts of musculoskeletal system: Secondary | ICD-10-CM

## 2022-02-20 DIAGNOSIS — M25561 Pain in right knee: Secondary | ICD-10-CM | POA: Insufficient documentation

## 2022-02-20 DIAGNOSIS — R9389 Abnormal findings on diagnostic imaging of other specified body structures: Secondary | ICD-10-CM

## 2022-02-20 DIAGNOSIS — S93401S Sprain of unspecified ligament of right ankle, sequela: Secondary | ICD-10-CM

## 2022-02-20 DIAGNOSIS — M899 Disorder of bone, unspecified: Secondary | ICD-10-CM

## 2022-02-20 DIAGNOSIS — M7671 Peroneal tendinitis, right leg: Secondary | ICD-10-CM

## 2022-02-20 DIAGNOSIS — G608 Other hereditary and idiopathic neuropathies: Secondary | ICD-10-CM

## 2022-02-20 DIAGNOSIS — G5781 Other specified mononeuropathies of right lower limb: Secondary | ICD-10-CM

## 2022-02-20 HISTORY — DX: Abnormal findings on diagnostic imaging of other specified body structures: R93.89

## 2022-02-20 HISTORY — DX: Unspecified synovitis and tenosynovitis, right ankle and foot: M65.971

## 2022-02-20 HISTORY — DX: Sprain of unspecified ligament of right ankle, sequela: S93.401S

## 2022-02-20 HISTORY — DX: Peroneal tendinitis, right leg: M76.71

## 2022-02-20 HISTORY — DX: Other chronic pain: G89.29

## 2022-02-20 HISTORY — DX: Other hereditary and idiopathic neuropathies: G60.8

## 2022-02-20 HISTORY — DX: Other specified mononeuropathies of right lower limb: G57.81

## 2022-02-20 HISTORY — DX: Abnormal findings on diagnostic imaging of other parts of musculoskeletal system: R93.7

## 2022-02-20 NOTE — Progress Notes (Signed)
Safety precautions to be maintained throughout the outpatient stay will include: orient to surroundings, keep bed in low position, maintain call bell within reach at all times, provide assistance with transfer out of bed and ambulation.  

## 2022-02-23 ENCOUNTER — Telehealth: Payer: Self-pay

## 2022-02-23 LAB — COMPLIANCE DRUG ANALYSIS, UR

## 2022-02-25 LAB — COMP. METABOLIC PANEL (12)
AST: 52 IU/L — ABNORMAL HIGH (ref 0–40)
Albumin/Globulin Ratio: 2 (ref 1.2–2.2)
Albumin: 4.6 g/dL (ref 3.8–4.9)
Alkaline Phosphatase: 78 IU/L (ref 44–121)
BUN/Creatinine Ratio: 20 (ref 9–23)
BUN: 17 mg/dL (ref 6–24)
Bilirubin Total: 0.3 mg/dL (ref 0.0–1.2)
Calcium: 10.1 mg/dL (ref 8.7–10.2)
Chloride: 105 mmol/L (ref 96–106)
Creatinine, Ser: 0.84 mg/dL (ref 0.57–1.00)
Globulin, Total: 2.3 g/dL (ref 1.5–4.5)
Glucose: 81 mg/dL (ref 70–99)
Potassium: 4.9 mmol/L (ref 3.5–5.2)
Sodium: 141 mmol/L (ref 134–144)
Total Protein: 6.9 g/dL (ref 6.0–8.5)
eGFR: 81 mL/min/{1.73_m2} (ref >59)

## 2022-02-25 LAB — MAGNESIUM: Magnesium: 2.2 mg/dL (ref 1.6–2.3)

## 2022-02-25 LAB — 25-HYDROXY VITAMIN D LCMS D2+D3
25-Hydroxy, Vitamin D-2: 2.1 ng/mL
25-Hydroxy, Vitamin D-3: 26 ng/mL
25-Hydroxy, Vitamin D: 28 ng/mL — ABNORMAL LOW

## 2022-02-25 LAB — VITAMIN B12: Vitamin B-12: 798 pg/mL (ref 232–1245)

## 2022-02-25 LAB — C-REACTIVE PROTEIN: CRP: <1 mg/L (ref 0–10)

## 2022-02-25 LAB — SEDIMENTATION RATE: Sed Rate: 15 mm/hr (ref 0–40)

## 2022-03-02 NOTE — Telephone Encounter (Signed)
Will you do a peer to peer? The patient states that's the only way they will approve.

## 2022-03-09 ENCOUNTER — Encounter: Payer: Self-pay | Admitting: Pain Medicine

## 2022-03-09 DIAGNOSIS — E559 Vitamin D deficiency, unspecified: Secondary | ICD-10-CM

## 2022-03-09 HISTORY — DX: Vitamin D deficiency, unspecified: E55.9

## 2022-04-17 ENCOUNTER — Ambulatory Visit: Payer: Commercial Managed Care - HMO | Admitting: Pain Medicine

## 2023-06-19 DIAGNOSIS — J309 Allergic rhinitis, unspecified: Secondary | ICD-10-CM

## 2023-06-19 HISTORY — DX: Allergic rhinitis, unspecified: J30.9

## 2023-07-16 ENCOUNTER — Other Ambulatory Visit: Payer: Self-pay | Admitting: Family

## 2023-07-16 DIAGNOSIS — Z1231 Encounter for screening mammogram for malignant neoplasm of breast: Secondary | ICD-10-CM

## 2023-07-20 ENCOUNTER — Ambulatory Visit
Admission: RE | Admit: 2023-07-20 | Discharge: 2023-07-20 | Disposition: A | Discharge status: Home / Self Care | Payer: Managed Care, Other (non HMO) | Source: Ambulatory Visit | Attending: Family | Admitting: Family

## 2023-07-20 DIAGNOSIS — Z1231 Encounter for screening mammogram for malignant neoplasm of breast: Secondary | ICD-10-CM

## 2023-09-10 ENCOUNTER — Other Ambulatory Visit: Payer: Self-pay

## 2023-09-10 ENCOUNTER — Encounter: Payer: Self-pay | Admitting: *Deleted

## 2023-09-10 DIAGNOSIS — G603 Idiopathic progressive neuropathy: Secondary | ICD-10-CM | POA: Insufficient documentation

## 2023-09-10 DIAGNOSIS — M1990 Unspecified osteoarthritis, unspecified site: Secondary | ICD-10-CM | POA: Insufficient documentation

## 2023-09-10 DIAGNOSIS — E669 Obesity, unspecified: Secondary | ICD-10-CM | POA: Insufficient documentation

## 2023-09-10 DIAGNOSIS — F419 Anxiety disorder, unspecified: Secondary | ICD-10-CM | POA: Insufficient documentation

## 2023-09-10 DIAGNOSIS — F410 Panic disorder [episodic paroxysmal anxiety] without agoraphobia: Secondary | ICD-10-CM | POA: Insufficient documentation

## 2023-09-10 DIAGNOSIS — K219 Gastro-esophageal reflux disease without esophagitis: Secondary | ICD-10-CM | POA: Insufficient documentation

## 2023-09-10 DIAGNOSIS — Z6835 Body mass index (BMI) 35.0-35.9, adult: Secondary | ICD-10-CM | POA: Insufficient documentation

## 2023-09-10 DIAGNOSIS — F3189 Other bipolar disorder: Secondary | ICD-10-CM | POA: Insufficient documentation

## 2023-09-10 DIAGNOSIS — Z6834 Body mass index (BMI) 34.0-34.9, adult: Secondary | ICD-10-CM | POA: Insufficient documentation

## 2023-09-10 DIAGNOSIS — J309 Allergic rhinitis, unspecified: Secondary | ICD-10-CM | POA: Insufficient documentation

## 2023-09-10 DIAGNOSIS — E559 Vitamin D deficiency, unspecified: Secondary | ICD-10-CM | POA: Insufficient documentation

## 2023-09-10 DIAGNOSIS — F32A Depression, unspecified: Secondary | ICD-10-CM | POA: Insufficient documentation

## 2023-09-10 DIAGNOSIS — E785 Hyperlipidemia, unspecified: Secondary | ICD-10-CM | POA: Insufficient documentation

## 2023-09-10 DIAGNOSIS — R4701 Aphasia: Secondary | ICD-10-CM | POA: Insufficient documentation

## 2023-09-10 DIAGNOSIS — I10 Essential (primary) hypertension: Secondary | ICD-10-CM | POA: Insufficient documentation

## 2023-09-10 DIAGNOSIS — G709 Myoneural disorder, unspecified: Secondary | ICD-10-CM | POA: Insufficient documentation

## 2023-09-11 ENCOUNTER — Ambulatory Visit: Payer: Commercial Managed Care - HMO

## 2023-09-28 ENCOUNTER — Ambulatory Visit: Payer: Commercial Managed Care - HMO

## 2023-09-28 VITALS — BP 124/80 | HR 70 | Ht 63.0 in | Wt 213.0 lb

## 2023-09-28 DIAGNOSIS — R0789 Other chest pain: Secondary | ICD-10-CM | POA: Insufficient documentation

## 2023-09-28 DIAGNOSIS — E785 Hyperlipidemia, unspecified: Secondary | ICD-10-CM

## 2023-09-28 NOTE — Patient Instructions (Signed)
Medication Instructions:   Your physician recommends that you continue on your current medications as directed. Please refer to the Current Medication list given to you today.  *If you need a refill on your cardiac medications before your next appointment, please call your pharmacy*   Lab Work:  None Ordered  If you have labs (blood work) drawn today and your tests are completely normal, you will receive your results only by: MyChart Message (if you have MyChart) OR A paper copy in the mail If you have any lab test that is abnormal or we need to change your treatment, we will call you to review the results.   Testing/Procedures:  None Ordered   Follow-Up: At [facility], you and your health needs are our priority.  As part of our continuing mission to provide you with exceptional heart care, we have created designated Provider Care Teams.  These Care Teams include your primary Cardiologist (physician) and Advanced Practice Providers (APPs -  Physician Assistants and Nurse Practitioners) who all work together to provide you with the care you need, when you need it.  We recommend signing up for the patient portal called "MyChart".  Sign up information is provided on this After Visit Summary.  MyChart is used to connect with patients for Virtual Visits (Telemedicine).  Patients are able to view lab/test results, encounter notes, upcoming appointments, etc.  Non-urgent messages can be sent to your provider as well.   To learn more about what you can do with MyChart, go to [email or web address].    Your next appointment:  As needed

## 2023-09-28 NOTE — Progress Notes (Signed)
Cardiology Consultation:    Date:  09/28/2023   ID:  Yvonne Clark, Yvonne Clark 31-May-1966, MRN [id number]  PCP:  Erskine Emery, NP  Cardiologist:  Marlyn Corporal Lilyona Richner, MD   Referring MD: Erskine Emery, NP   No chief complaint on file.    ASSESSMENT AND PLAN:   Yvonne Clark 58 year old woman with obesity, GERD, small hiatal hernia, depression, anxiety, conversion disorder, former smoker quit 33 years ago, with the upper abdominal discomfort symptoms associated with chest discomfort for the past couple months symptoms improved with GI cocktail while in the ER on 08/11/2023 and subsequently with Protonix and symptoms do not sound to be of cardiac origin.  Problem List Items Addressed This Visit     Hyperlipidemia - Primary   Relevant Orders   EKG 12-Lead (Completed)   Chest pain, non-cardiac   Her symptoms appear noncardiac in description. With hiatal hernia diagnosed on EGD and symptoms improving with GI cocktail and Protonix, will hold off on further cardiac workup. If she has any further change in symptoms or characteristics that suggest cardiac origin or atypical nature, will consider further cardiac workup with stress test or CT coronary angiogram depending on the symptoms and ongoing issues.  At this time we will hold off on any further cardiac testing and she can continue to follow-up with her PCP and see [place] as needed.       Return to clinic as needed.   History of Present Illness:    Yvonne Clark is a 58 y.o. female who is being seen today for the evaluation of chest discomfort at the request of Erskine Emery, NP.  Denies any prior history of CAD, CHF, MI, CVA. Has history of depression, anxiety, conversion disorder, asthma, obesity, GERD, recently diagnosed with hiatal hernia on EGD 09-27-2023 at [facility].  Former smoker quit 33 years ago.  Pleasant woman here for the visit accompanied by her husband.  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  She mentions  she keeps herself physically active with aquatic exercises during the summer months.  During winter months she is more or less limited with her physical activity.  The last couple months she has been having symptoms of upper abdominal and chest discomfort random, lasts for an extended time.  Not associated with shortness of breath, palpitations, orthopnea.  Symptoms were intense after her visit with PCP when cardiology referral was placed back in December.  Subsequently she was seen at the ER at [facility] on 08/11/2023 for mild abdominal discomfort.  Symptoms improved with GI cocktail.  She has had EGD done yesterday 09-27-2023 at [facility] and was noted to have small hiatal hernia and biopsies were done pathology pending.  She mentions she does not have any symptoms of chest pain, orthopnea, paroxysmal nocturnal dyspnea.  No pedal edema no palpitations, lightheadedness, dizziness or syncopal episodes. She does not feel significant change in her overall capacity.  Former walker quit 33 years ago. Drinks alcohol rarely. Coffee 1 cup a day.  EKG in the clinic today shows sinus rhythm heart rate 70/min, PR interval 166 ms, QTc 423 ms.  Labs from 08/11/2023 mildly elevated AST 41, ALT 42. Normal BUN 16, creatinine 0.97, EGFR 68 CBC unremarkable.  Past Medical History:  Diagnosis Date   Abnormal ankle MRI (Right) (02/03/2022) 02/20/2022   (02/03/2022) RIGHT ANKLE MRI FINDINGS:  TENDONS  Peroneal: Mild peroneus longus and brevis tenosynovitis, similar to prior.  Posteromedial: Intact tibialis posterior, flexor digitorum longus,  and flexor hallucis longus tendons.  Anterior: The tibialis anterior, extensor hallucis longus, and extensor digitorum longus tendons are intact.  Achilles: Intact.  Plantar Fascia: Intact.     LIGAMENTS  Lat   Abnormal MRI, lumbar spine (02/04/2019) 02/20/2022   IMPRESSION:  1. Status post L3-4 left laminectomy and discectomy with removal of large central  disc extrusion. No residual spinal canal or neural foraminal stenosis. Mild narrowing of the left lateral recess secondary to eccentric disc bulge.  2. Small amount of granulation tissue within the left spinal canal at the L4-5 level without associated spinal canal or neural foraminal stenosis.  3. 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) 02/20/2022   Disorder of skeletal system 02/19/2022   Essential (primary) hypertension    GERD without esophagitis    Hyperlipidemia    Idiopathic progressive neuropathy    Neuromuscular disorder (HCC)    Numbness and tingling 10/21/2021   Obesity    Other bipolar disorder (HCC)    Panic disorder    Peroneal tendinitis (Right) 02/20/2022   Pharmacologic therapy 02/19/2022   Problems influencing health status 02/19/2022   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     IMPRESSION:  1. Mild thickening of the anterior talofibular ligament suggesting tendinosis/chronic sprain.  2. Mild peroneus longus and brevis tenosynovitis.     Vitamin D deficiency    Vitamin D insufficiency 03/09/2022    Past Surgical History:  Procedure Laterality Date   ABDOMINAL HYSTERECTOMY     APPENDECTOMY     BREAST SURGERY     CHOLECYSTECTOMY     SPINE SURGERY      Current Medications: Current  Meds  Medication Sig   albuterol (VENTOLIN HFA) 108 (90 Base) MCG/ACT inhaler Inhale 2 puffs into the lungs every 4 (four) hours as needed for wheezing or shortness of breath.   ARIPiprazole (ABILIFY) 5 MG tablet Take 5 mg by mouth daily.   Azelastine HCl 137 MCG/SPRAY SOLN Place 2 sprays into the nose 2 (two) times daily as needed (allergies or rhinitis).   buPROPion (WELLBUTRIN XL) 300 MG 24 hr tablet Take 300 mg by mouth daily.   citalopram (CELEXA) 40 MG tablet Take 40 mg by mouth daily.   clonazePAM (KLONOPIN) 1 MG tablet Take 2 mg by mouth at bedtime.   dicyclomine (BENTYL) 10 MG capsule Take 10 mg by mouth every 6 (six) hours as needed (bloating).   EPINEPHrine 0.3 mg/0.3 mL IJ SOAJ injection Inject 0.3 mg into the muscle as needed for anaphylaxis.   famotidine (PEPCID) 40 MG tablet Take 40 mg by mouth 2 (two) times daily.   Fluticasone-Umeclidin-Vilant (TRELEGY ELLIPTA) 200-62.5-25 MCG/ACT AEPB Inhale 1 puff into the lungs daily.   ibuprofen (ADVIL) 800 MG tablet Take 800 mg  by mouth 3 (three) times daily as needed for headache, mild pain (pain score 1-3) or moderate pain (pain score 4-6).   pantoprazole (PROTONIX) 40 MG tablet Take 40 mg by mouth every morning.   sucralfate (CARAFATE) 1 g tablet Take 1 g by mouth 2 (two) times daily.   UBRELVY 100 MG TABS Take 1 tablet by mouth daily as needed (migraine).   valACYclovir (VALTREX) 500 MG tablet Take 500 mg by mouth as needed (cold sores).     Allergies:   Ciprofloxacin, Codeine, Hydrocodone-acetaminophen, Iodine, Latex, Sulfa antibiotics, and Penicillins   Social History   Socioeconomic History   Marital status: Married    Spouse name: Not on file   Number of children: Not on file   Years of education: Not on file   Highest education level: Not on file  Occupational History   Not on file  Tobacco Use   Smoking status: Former    Types: Cigarettes   Smokeless tobacco: Never  Substance and Sexual Activity   Alcohol use: Not  on file   Drug use: Not on file   Sexual activity: Not on file  Other Topics Concern   Not on file  Social History Narrative   Not on file   Social Drivers of Health   Financial Resource Strain: Not on file  Food Insecurity: Not on file  Transportation Needs: Not on file  Physical Activity: Not on file  Stress: Not on file  Social Connections: Unknown (01/02/2022)   Received from [facility]   Social Network    Social Network: Not on file     Family History: The patient's family history includes Cancer in her father and mother; Depression in her mother; Hyperlipidemia in her father and mother. ROS:   Please see the history of present illness.    All 14 point review of systems negative except as described per history of present illness.  EKGs/Labs/Other Studies Reviewed:    The following studies were reviewed today:   EKG:  EKG Interpretation Date/Time:  Friday September 28 2023 11:15:27 EST Ventricular Rate:  70 PR Interval:  166 QRS Duration:  92 QT Interval:  392 QTC Calculation: 423 R Axis:   54  Text Interpretation: Normal sinus rhythm Normal ECG No previous ECGs available Confirmed by Huntley Dec reddy [phone]) on 09/28/2023 11:34:54 AM    Recent Labs: No results found for requested labs within last 365 days.  Recent Lipid Panel No results found for: "CHOL", "TRIG", "HDL", "CHOLHDL", "VLDL", "LDLCALC", "LDLDIRECT"  Physical Exam:    VS:  BP 124/80   Pulse 70   Ht 5\' 3"  (1.6 m)   Wt 213 lb (96.6 kg)   SpO2 97%   BMI 37.73 kg/m     Wt Readings from Last 3 Encounters:  09/28/23 213 lb (96.6 kg)  02/20/22 178 lb 11.2 oz (81.1 kg)     GENERAL:  Well nourished, well developed in no acute distress NECK: No JVD; No carotid bruits CARDIAC: RRR, S1 and S2 present, no murmurs, no rubs, no gallops CHEST:  Clear to auscultation without rales, wheezing or rhonchi  Extremities: No pitting pedal edema. Pulses bilaterally symmetric with radial  2+ and dorsalis pedis 2+ NEUROLOGIC:  Alert and oriented x 3  Medication Adjustments/Labs and Tests Ordered: Current medicines are reviewed at length with the patient today.  Concerns regarding medicines are outlined above.  Orders Placed This Encounter  Procedures   EKG 12-Lead   No orders of  the defined types were placed in this encounter.   Signed, Cecille Amsterdam, MD, MPH, [facility]. 09/28/2023 11:55 AM    [facility]

## 2023-09-28 NOTE — Assessment & Plan Note (Signed)
Her symptoms appear noncardiac in description. With hiatal hernia diagnosed on EGD and symptoms improving with GI cocktail and Protonix, will hold off on further cardiac workup. If she has any further change in symptoms or characteristics that suggest cardiac origin or atypical nature, will consider further cardiac workup with stress test or CT coronary angiogram depending on the symptoms and ongoing issues.  At this time we will hold off on any further cardiac testing and she can continue to follow-up with her PCP and see [place] as needed.

## 2024-06-06 ENCOUNTER — Other Ambulatory Visit (HOSPITAL_BASED_OUTPATIENT_CLINIC_OR_DEPARTMENT_OTHER): Payer: Self-pay

## 2024-06-06 DIAGNOSIS — M26601 Right temporomandibular joint disorder, unspecified: Secondary | ICD-10-CM

## 2024-06-06 DIAGNOSIS — R6884 Jaw pain: Secondary | ICD-10-CM

## 2024-06-11 ENCOUNTER — Inpatient Hospital Stay (HOSPITAL_BASED_OUTPATIENT_CLINIC_OR_DEPARTMENT_OTHER): Admission: RE | Admit: 2024-06-11 | Discharge: 2024-06-11

## 2024-06-11 DIAGNOSIS — R6884 Jaw pain: Secondary | ICD-10-CM

## 2024-06-11 DIAGNOSIS — M26601 Right temporomandibular joint disorder, unspecified: Secondary | ICD-10-CM

## 2024-07-17 ENCOUNTER — Other Ambulatory Visit: Payer: Self-pay | Admitting: Family

## 2024-07-17 DIAGNOSIS — Z1231 Encounter for screening mammogram for malignant neoplasm of breast: Secondary | ICD-10-CM

## 2024-07-25 ENCOUNTER — Ambulatory Visit
Admission: RE | Admit: 2024-07-25 | Discharge: 2024-07-25 | Disposition: A | Discharge status: Home / Self Care | Payer: Managed Care, Other (non HMO) | Source: Ambulatory Visit | Attending: Family | Admitting: Family

## 2024-07-25 DIAGNOSIS — Z1231 Encounter for screening mammogram for malignant neoplasm of breast: Secondary | ICD-10-CM
